# Patient Record
Sex: Female | Born: 1935 | Race: White | Hispanic: No | Marital: Married | Smoking: Never smoker
Health system: Southern US, Community
[De-identification: ages and names within clinical notes are randomized; demographics above are authoritative.]

## PROBLEM LIST (undated history)

## (undated) DIAGNOSIS — Z780 Asymptomatic menopausal state: Secondary | ICD-10-CM

## (undated) DIAGNOSIS — K219 Gastro-esophageal reflux disease without esophagitis: Secondary | ICD-10-CM

## (undated) HISTORY — PX: ABDOMINAL HYSTERECTOMY: SHX81

## (undated) HISTORY — PX: TONSILLECTOMY: SUR1361

## (undated) HISTORY — PX: OTHER SURGICAL HISTORY: SHX169

## (undated) HISTORY — DX: Gastro-esophageal reflux disease without esophagitis: K21.9

## (undated) HISTORY — DX: Asymptomatic menopausal state: Z78.0

---

## 1998-04-13 ENCOUNTER — Other Ambulatory Visit: Admission: RE | Admit: 1998-04-13 | Discharge: 1998-04-13 | Payer: Self-pay | Admitting: Radiology

## 1998-09-27 ENCOUNTER — Ambulatory Visit (HOSPITAL_COMMUNITY): Admission: RE | Admit: 1998-09-27 | Discharge: 1998-09-27 | Payer: Self-pay | Admitting: General Surgery

## 1998-09-27 ENCOUNTER — Encounter: Payer: Self-pay | Admitting: General Surgery

## 2005-07-17 ENCOUNTER — Encounter: Admission: RE | Admit: 2005-07-17 | Discharge: 2005-07-17 | Payer: Self-pay | Admitting: Family Medicine

## 2006-06-17 ENCOUNTER — Ambulatory Visit (HOSPITAL_COMMUNITY): Admission: RE | Admit: 2006-06-17 | Discharge: 2006-06-17 | Payer: Self-pay | Admitting: Gastroenterology

## 2006-08-02 IMAGING — CR DG ABDOMEN 2V
3 series · 3 of 3 positions shown · non-contrast
Comparison: none

CLINICAL DATA: Left lower quadrant pain. 
 ABDOMEN ? 2 VIEW:
 Supine and erect views of the abdomen show no bowel obstruction.  No free air is seen.  There is degenerative change in the lower lumbar spine.

[view not recorded (1 of 3)]
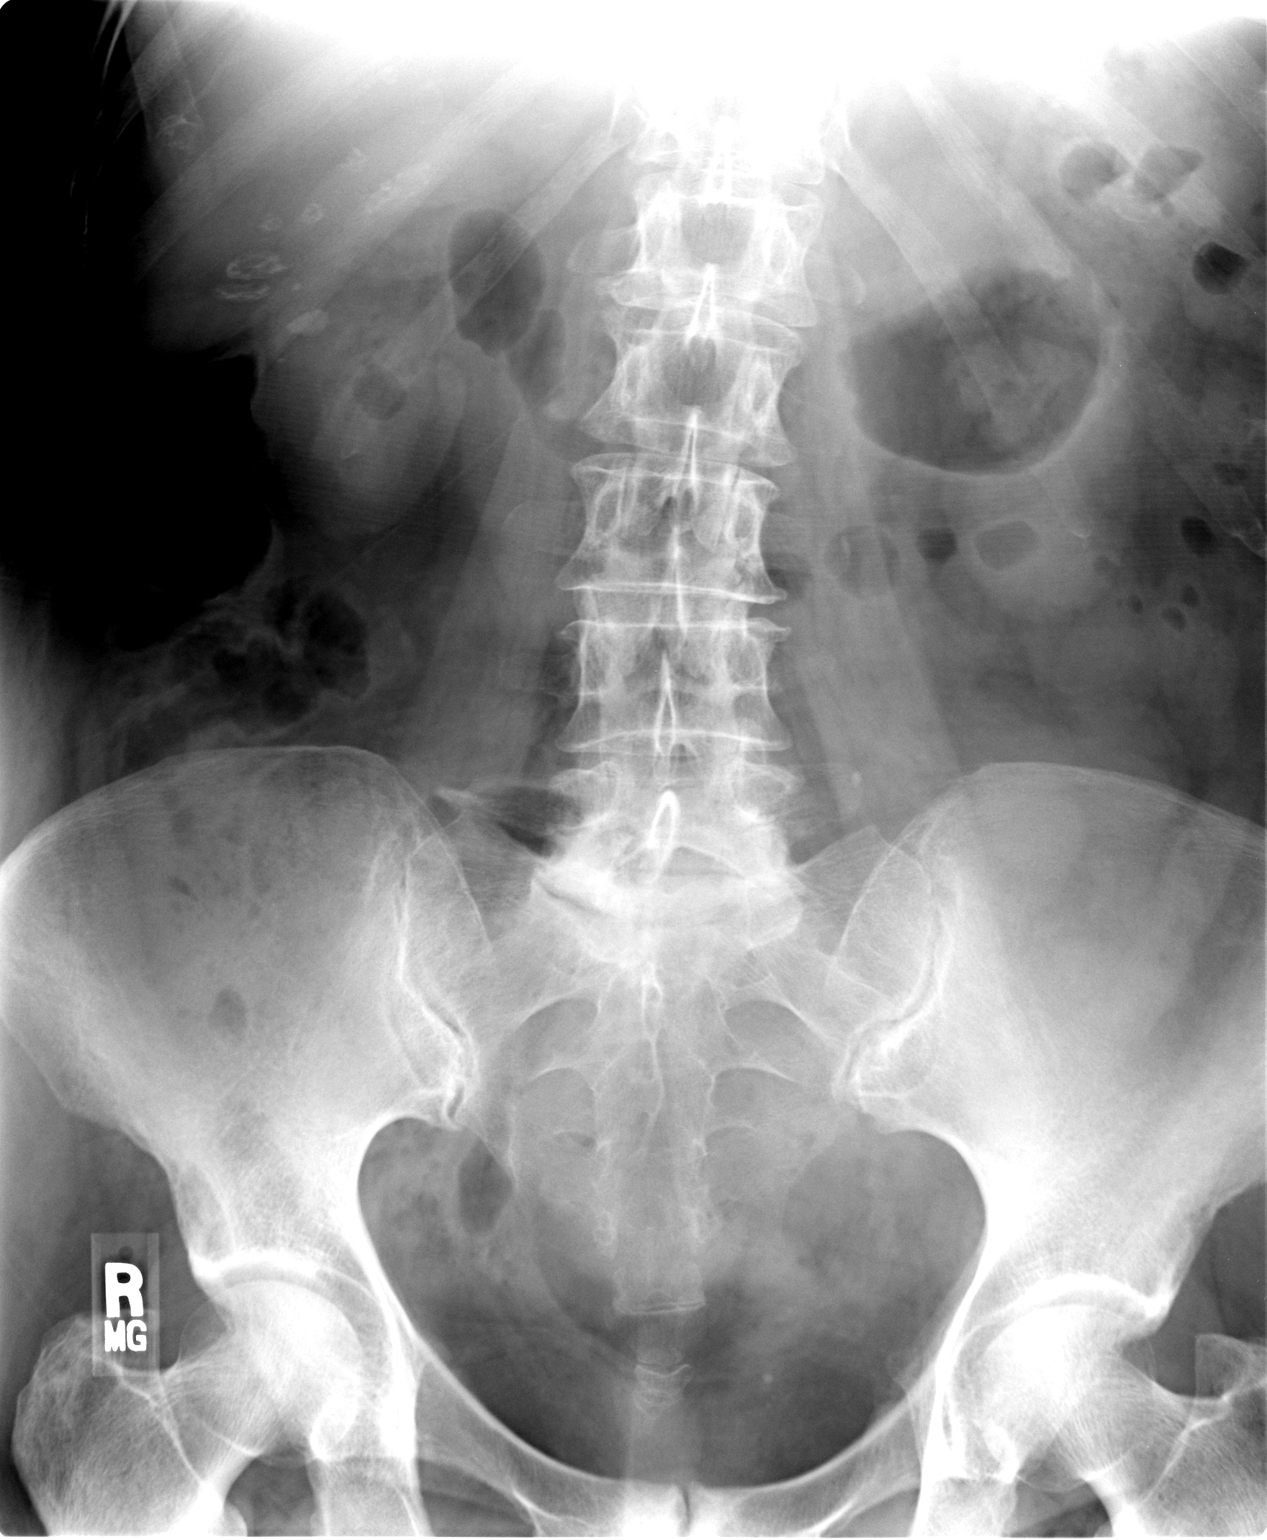

[view not recorded (2 of 3)]
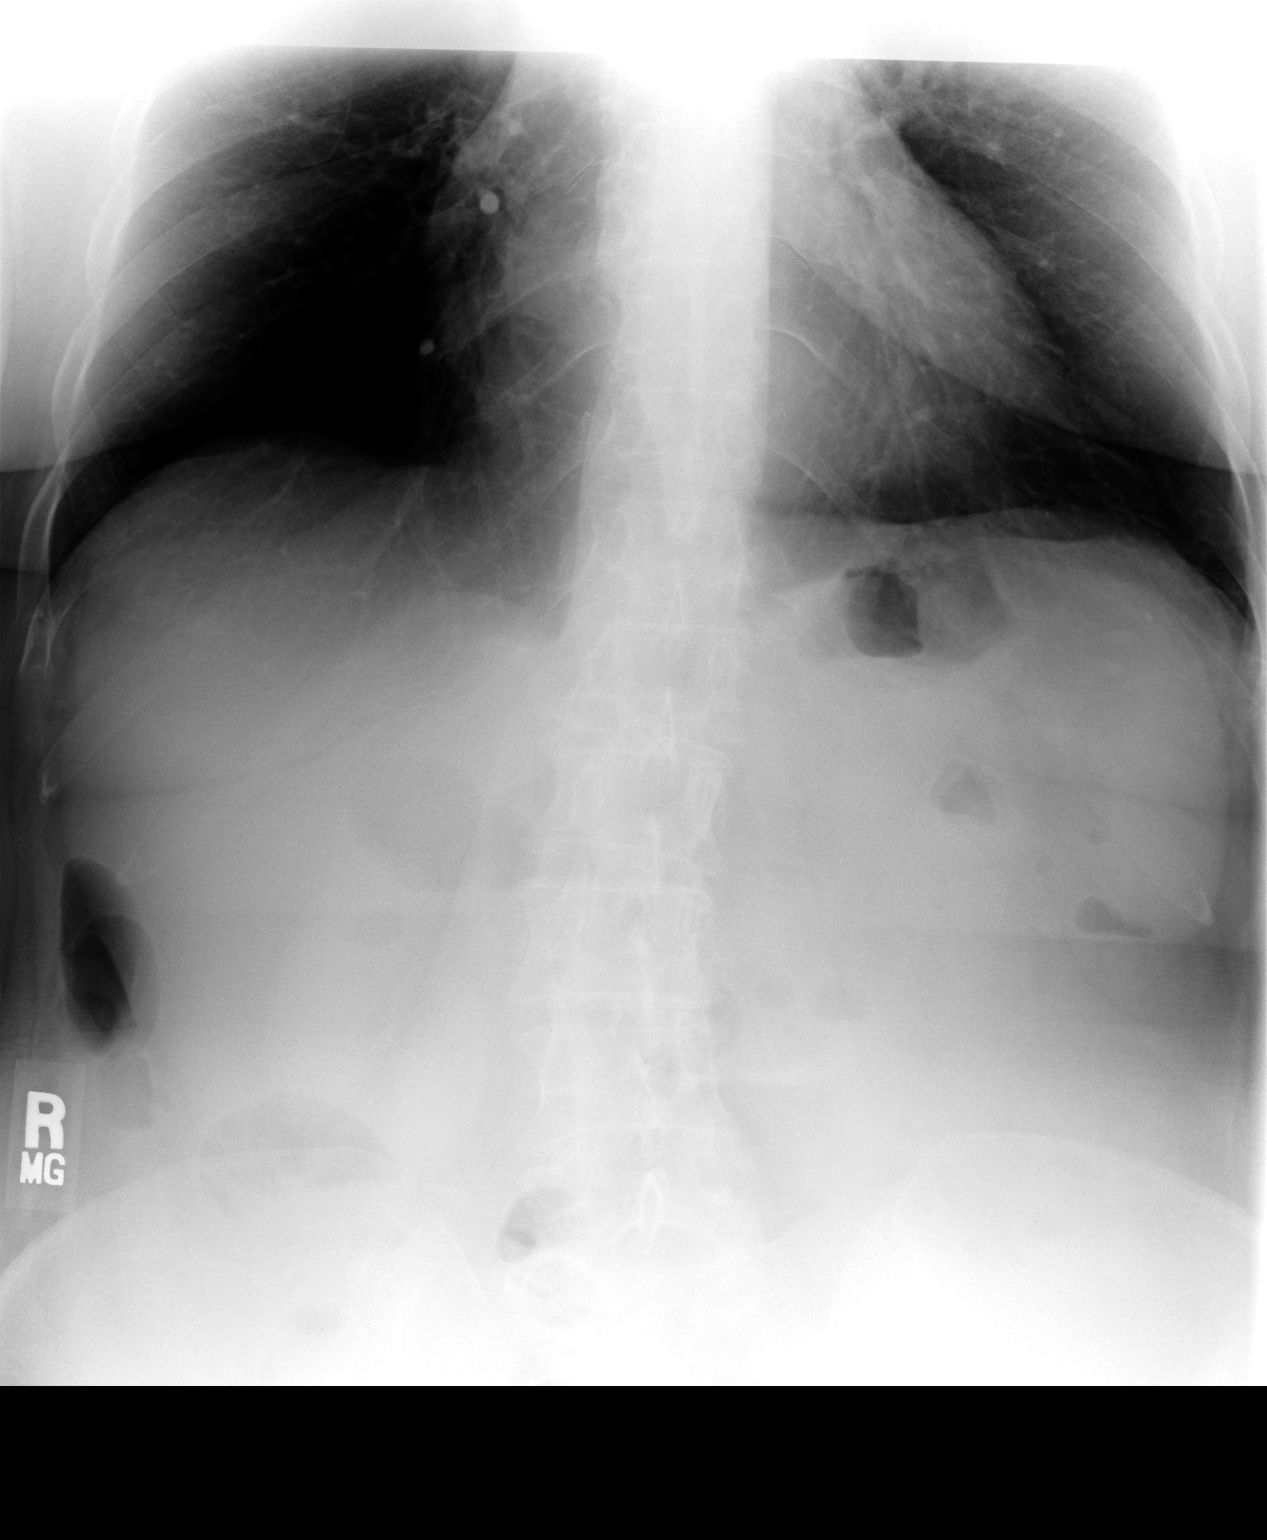

[view not recorded (3 of 3)]
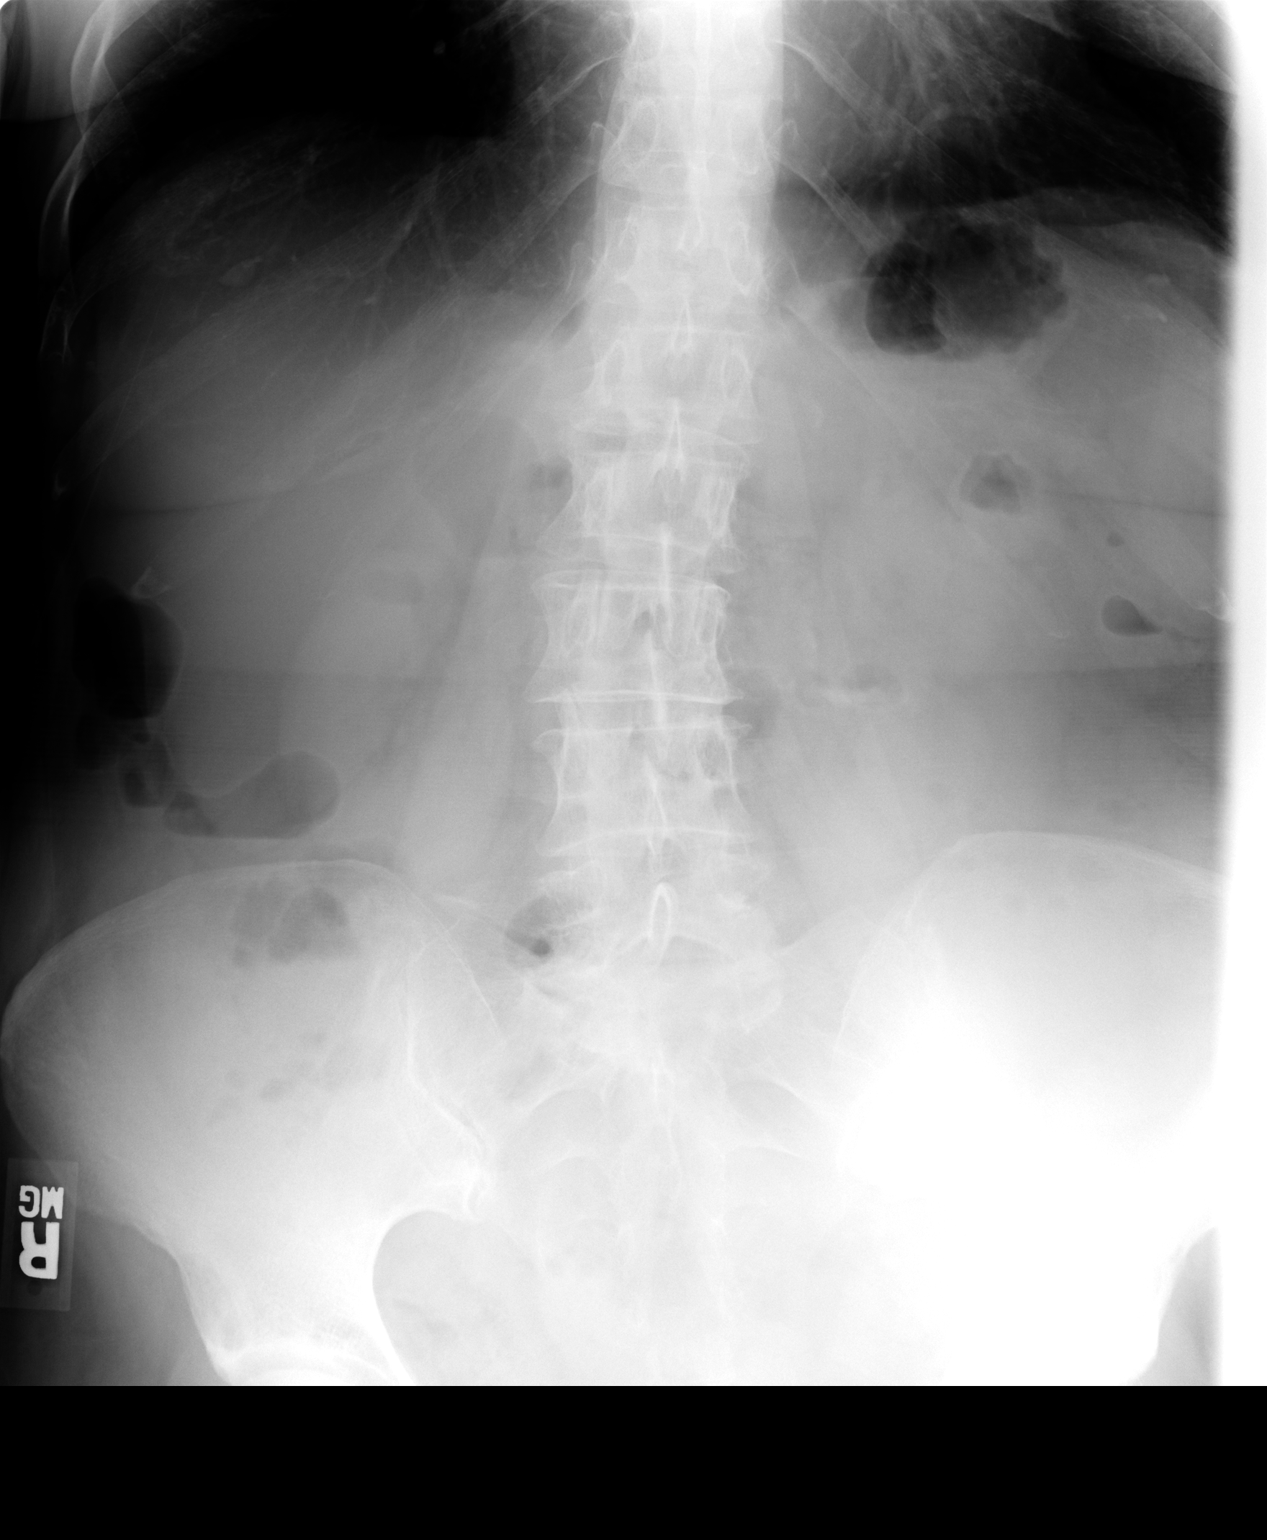

[3 of 3 positions shown; findings below may reference images not displayed]

IMPRESSION: No obstruction or free air.

## 2014-09-21 ENCOUNTER — Encounter: Payer: Self-pay | Admitting: *Deleted

## 2016-04-09 DIAGNOSIS — B079 Viral wart, unspecified: Secondary | ICD-10-CM | POA: Diagnosis not present

## 2016-05-15 DIAGNOSIS — B079 Viral wart, unspecified: Secondary | ICD-10-CM | POA: Diagnosis not present

## 2016-06-03 DIAGNOSIS — Z1231 Encounter for screening mammogram for malignant neoplasm of breast: Secondary | ICD-10-CM | POA: Diagnosis not present

## 2016-06-13 DIAGNOSIS — Z0000 Encounter for general adult medical examination without abnormal findings: Secondary | ICD-10-CM | POA: Diagnosis not present

## 2016-06-13 DIAGNOSIS — R7309 Other abnormal glucose: Secondary | ICD-10-CM | POA: Diagnosis not present

## 2016-06-13 DIAGNOSIS — B079 Viral wart, unspecified: Secondary | ICD-10-CM | POA: Diagnosis not present

## 2016-08-15 DIAGNOSIS — H5213 Myopia, bilateral: Secondary | ICD-10-CM | POA: Diagnosis not present

## 2016-10-02 DIAGNOSIS — Z23 Encounter for immunization: Secondary | ICD-10-CM | POA: Diagnosis not present

## 2017-06-04 DIAGNOSIS — Z1231 Encounter for screening mammogram for malignant neoplasm of breast: Secondary | ICD-10-CM | POA: Diagnosis not present

## 2017-06-06 DIAGNOSIS — R922 Inconclusive mammogram: Secondary | ICD-10-CM | POA: Diagnosis not present

## 2017-07-03 DIAGNOSIS — R7309 Other abnormal glucose: Secondary | ICD-10-CM | POA: Diagnosis not present

## 2017-07-03 DIAGNOSIS — Z23 Encounter for immunization: Secondary | ICD-10-CM | POA: Diagnosis not present

## 2017-07-03 DIAGNOSIS — Z0000 Encounter for general adult medical examination without abnormal findings: Secondary | ICD-10-CM | POA: Diagnosis not present

## 2017-07-03 DIAGNOSIS — L309 Dermatitis, unspecified: Secondary | ICD-10-CM | POA: Diagnosis not present

## 2017-10-02 DIAGNOSIS — Z23 Encounter for immunization: Secondary | ICD-10-CM | POA: Diagnosis not present

## 2018-01-16 DIAGNOSIS — H5203 Hypermetropia, bilateral: Secondary | ICD-10-CM | POA: Diagnosis not present

## 2018-06-03 DIAGNOSIS — Z1231 Encounter for screening mammogram for malignant neoplasm of breast: Secondary | ICD-10-CM | POA: Diagnosis not present

## 2018-07-08 DIAGNOSIS — Z0000 Encounter for general adult medical examination without abnormal findings: Secondary | ICD-10-CM | POA: Diagnosis not present

## 2018-07-08 DIAGNOSIS — R252 Cramp and spasm: Secondary | ICD-10-CM | POA: Diagnosis not present

## 2018-07-08 DIAGNOSIS — R7309 Other abnormal glucose: Secondary | ICD-10-CM | POA: Diagnosis not present

## 2018-07-08 DIAGNOSIS — L309 Dermatitis, unspecified: Secondary | ICD-10-CM | POA: Diagnosis not present

## 2018-09-25 DIAGNOSIS — Z23 Encounter for immunization: Secondary | ICD-10-CM | POA: Diagnosis not present

## 2018-12-17 DIAGNOSIS — Z0120 Encounter for dental examination and cleaning without abnormal findings: Secondary | ICD-10-CM | POA: Diagnosis not present

## 2019-06-07 DIAGNOSIS — Z1231 Encounter for screening mammogram for malignant neoplasm of breast: Secondary | ICD-10-CM | POA: Diagnosis not present

## 2019-07-21 DIAGNOSIS — H5213 Myopia, bilateral: Secondary | ICD-10-CM | POA: Diagnosis not present

## 2019-08-13 DIAGNOSIS — R7309 Other abnormal glucose: Secondary | ICD-10-CM | POA: Diagnosis not present

## 2019-08-13 DIAGNOSIS — L309 Dermatitis, unspecified: Secondary | ICD-10-CM | POA: Diagnosis not present

## 2019-08-13 DIAGNOSIS — B356 Tinea cruris: Secondary | ICD-10-CM | POA: Diagnosis not present

## 2019-08-13 DIAGNOSIS — Z0000 Encounter for general adult medical examination without abnormal findings: Secondary | ICD-10-CM | POA: Diagnosis not present

## 2019-08-20 DIAGNOSIS — Z23 Encounter for immunization: Secondary | ICD-10-CM | POA: Diagnosis not present

## 2019-08-20 DIAGNOSIS — R7309 Other abnormal glucose: Secondary | ICD-10-CM | POA: Diagnosis not present

## 2019-12-16 DIAGNOSIS — Z0120 Encounter for dental examination and cleaning without abnormal findings: Secondary | ICD-10-CM | POA: Diagnosis not present

## 2020-03-28 DIAGNOSIS — H9312 Tinnitus, left ear: Secondary | ICD-10-CM | POA: Diagnosis not present

## 2020-03-28 DIAGNOSIS — H9113 Presbycusis, bilateral: Secondary | ICD-10-CM | POA: Insufficient documentation

## 2020-04-13 DIAGNOSIS — H90A21 Sensorineural hearing loss, unilateral, right ear, with restricted hearing on the contralateral side: Secondary | ICD-10-CM | POA: Diagnosis not present

## 2020-06-19 DIAGNOSIS — Z1231 Encounter for screening mammogram for malignant neoplasm of breast: Secondary | ICD-10-CM | POA: Diagnosis not present

## 2020-08-09 DIAGNOSIS — Z961 Presence of intraocular lens: Secondary | ICD-10-CM | POA: Diagnosis not present

## 2020-08-09 DIAGNOSIS — H35363 Drusen (degenerative) of macula, bilateral: Secondary | ICD-10-CM | POA: Diagnosis not present

## 2020-08-09 DIAGNOSIS — H35372 Puckering of macula, left eye: Secondary | ICD-10-CM | POA: Diagnosis not present

## 2020-08-15 DIAGNOSIS — R7309 Other abnormal glucose: Secondary | ICD-10-CM | POA: Diagnosis not present

## 2020-08-15 DIAGNOSIS — L84 Corns and callosities: Secondary | ICD-10-CM | POA: Diagnosis not present

## 2020-08-15 DIAGNOSIS — Z0000 Encounter for general adult medical examination without abnormal findings: Secondary | ICD-10-CM | POA: Diagnosis not present

## 2020-08-15 DIAGNOSIS — K429 Umbilical hernia without obstruction or gangrene: Secondary | ICD-10-CM | POA: Diagnosis not present

## 2020-11-09 ENCOUNTER — Emergency Department (HOSPITAL_COMMUNITY)
Admission: EM | Admit: 2020-11-09 | Discharge: 2020-11-09 | Disposition: A | Discharge status: Home / Self Care | Payer: Medicare Other | Attending: Emergency Medicine | Admitting: Emergency Medicine

## 2020-11-09 ENCOUNTER — Emergency Department (HOSPITAL_COMMUNITY): Payer: Medicare Other

## 2020-11-09 ENCOUNTER — Other Ambulatory Visit: Payer: Self-pay

## 2020-11-09 ENCOUNTER — Encounter (HOSPITAL_COMMUNITY): Payer: Self-pay

## 2020-11-09 DIAGNOSIS — W01198A Fall on same level from slipping, tripping and stumbling with subsequent striking against other object, initial encounter: Secondary | ICD-10-CM | POA: Insufficient documentation

## 2020-11-09 DIAGNOSIS — S0101XA Laceration without foreign body of scalp, initial encounter: Secondary | ICD-10-CM | POA: Insufficient documentation

## 2020-11-09 DIAGNOSIS — S0191XA Laceration without foreign body of unspecified part of head, initial encounter: Secondary | ICD-10-CM | POA: Diagnosis not present

## 2020-11-09 DIAGNOSIS — W19XXXA Unspecified fall, initial encounter: Secondary | ICD-10-CM

## 2020-11-09 DIAGNOSIS — S0990XA Unspecified injury of head, initial encounter: Secondary | ICD-10-CM | POA: Diagnosis present

## 2020-11-09 MED ORDER — LIDOCAINE-EPINEPHRINE 1 %-1:100000 IJ SOLN
10.0000 mL | Freq: Once | INTRAMUSCULAR | Status: AC
  Administered 2020-11-09: 10 mL
  Filled 2020-11-09: qty 1

## 2020-11-09 NOTE — ED Notes (Signed)
Cleansed pt's wound

## 2020-11-09 NOTE — ED Provider Notes (Signed)
[facility] EMERGENCY DEPARTMENT Provider Note   CSN: [id number] Arrival date & time: 11/09/20  1601     History Chief Complaint  Patient presents with  . Fall    Jeanne Wright is a 86 y.o. female.  86 year old female presents with laceration to the scalp after a fall. Patient states she was in her basement when she lost her balance and fell backwards, hitting the back of her head on a support pole. Denies feeling weak or dizzy prior to the fall, denies loss of consciousness, is not anticoagulated. Patient was able to get herself back up after the fall and has been ambulatory without difficulty. Denies neck or back pain, upper or lower extremity injury. No other complaints or concerns.         Past Medical History:  Diagnosis Date  . Esophageal reflux   . Menopause     There are no problems to display for this patient.   Past Surgical History:  Procedure Laterality Date  . ABDOMINAL HYSTERECTOMY    . LEFT BREAST LUMP    . TONSILLECTOMY       OB History   No obstetric history on file.     Family History  Problem Relation Age of Onset  . Diabetes Mother   . Leukemia Father   . Diabetes Sister   . Hypertension Sister   . Cancer Sister   . Cancer Sister   . Diabetes Sister     Social History   Tobacco Use  . Smoking status: Never Smoker  Substance Use Topics  . Alcohol use: No  . Drug use: No    Home Medications Prior to Admission medications   Medication Sig Start Date End Date Taking? Authorizing Provider  Calcium Carbonate-Vitamin D (CALCIUM 600+D) 600-400 MG-UNIT per tablet Take 1 tablet by mouth daily.    [provider]  Vitamins-Lipotropics (LIPOFLAVONOID PO) Take by mouth.    [provider]    Allergies    Sulfa antibiotics  Review of Systems   Review of Systems  Constitutional: Negative for fever.  Gastrointestinal: Negative for nausea and vomiting.  Musculoskeletal: Negative for arthralgias,  back pain, gait problem, joint swelling, myalgias, neck pain and neck stiffness.  Skin: Positive for wound.  Allergic/Immunologic: Negative for immunocompromised state.  Neurological: Positive for headaches. Negative for dizziness, syncope and weakness.  Hematological: Does not bruise/bleed easily.  Psychiatric/Behavioral: Negative for confusion.  All other systems reviewed and are negative.   Physical Exam Updated Vital Signs BP (!) 165/60 (BP Location: Right Arm)   Pulse 66   Temp 98 F (36.7 C) (Oral)   Resp 15   SpO2 99%   Physical Exam Vitals and nursing note reviewed.  Constitutional:      General: She is not in acute distress.    Appearance: She is well-developed. She is not diaphoretic.  HENT:     Head: Normocephalic.      Mouth/Throat:     Mouth: Mucous membranes are moist.  Eyes:     Pupils: Pupils are equal, round, and reactive to light.  Cardiovascular:     Pulses: Normal pulses.  Pulmonary:     Effort: Pulmonary effort is normal.  Musculoskeletal:        General: No swelling or tenderness.     Cervical back: Normal range of motion. No tenderness.  Skin:    General: Skin is warm and dry.     Findings: No erythema or rash.  Neurological:  Mental Status: She is alert and oriented to person, place, and time.     Sensory: No sensory deficit.     Motor: No weakness.  Psychiatric:        Behavior: Behavior normal.     ED Results / Procedures / Treatments   Labs (all labs ordered are listed, but only abnormal results are displayed) Labs Reviewed - No data to display  EKG None  Radiology CT Head Wo Contrast  Result Date: 11/09/2020 CLINICAL DATA:  Trauma.  Fall down stairs. EXAM: CT HEAD WITHOUT CONTRAST TECHNIQUE: Contiguous axial images were obtained from the base of the skull through the vertex without intravenous contrast. COMPARISON:  None. FINDINGS: Brain: There is no mass, hemorrhage or extra-axial collection. The size and configuration of the  ventricles and extra-axial CSF spaces are normal. There is hypoattenuation of the white matter, most commonly indicating chronic small vessel disease. Small meningioma near the vertex measuring approximately 6 mm. Vascular: No abnormal hyperdensity of the major intracranial arteries or dural venous sinuses. No intracranial atherosclerosis. Skull: Large right vertex laceration and hematoma. Sinuses/Orbits: No fluid levels or advanced mucosal thickening of the visualized paranasal sinuses. No mastoid or middle ear effusion. The orbits are normal. IMPRESSION: 1. Large right vertex laceration and hematoma without underlying fracture or acute intracranial abnormality. 2. Small meningioma near the vertex measuring approximately 6 mm. Electronically Signed   By: Deatra Robinson M.D.   On: 11/09/2020 01:44    Procedures .Marland KitchenLaceration Repair  Date/Time: 11/09/2020 8:40 AM Performed by: Jeannie Fend, PA-C Authorized by: Jeannie Fend, PA-C   Consent:    Consent obtained:  Verbal   Consent given by:  Patient   Risks discussed:  Infection, need for additional repair, pain, poor cosmetic result and poor wound healing   Alternatives discussed:  No treatment and delayed treatment Universal protocol:    Procedure explained and questions answered to patient or proxy's satisfaction: yes     Relevant documents present and verified: yes     Test results available and properly labeled: yes     Imaging studies available: yes     Required blood products, implants, devices, and special equipment available: yes     Site/side marked: yes     Immediately prior to procedure, a time out was called: yes     Patient identity confirmed:  Verbally with patient Anesthesia (see MAR for exact dosages):    Anesthesia method:  Local infiltration   Local anesthetic:  Lidocaine 2% WITH epi Laceration details:    Location:  Scalp   Scalp location:  Occipital   Length (cm):  2.5   Depth (mm):  3 Repair type:    Repair type:   Simple Pre-procedure details:    Preparation:  Patient was prepped and draped in usual sterile fashion and imaging obtained to evaluate for foreign bodies Exploration:    Hemostasis achieved with:  Epinephrine   Wound exploration: wound explored through full range of motion and entire depth of wound probed and visualized     Wound extent: no foreign bodies/material noted, no muscle damage noted and no underlying fracture noted     Contaminated: no   Treatment:    Area cleansed with:  Saline   Amount of cleaning:  Standard   Irrigation solution:  Sterile saline Skin repair:    Repair method:  Sutures   Suture size:  4-0   Suture material:  Nylon   Suture technique:  Simple interrupted   Number  of sutures:  3 Approximation:    Approximation:  Close Post-procedure details:    Dressing:  Open (no dressing)   Patient tolerance of procedure:  Tolerated well, no immediate complications   (including critical care time)  Medications Ordered in ED Medications  lidocaine-EPINEPHrine (XYLOCAINE W/EPI) 1 %-1:100000 (with pres) injection 10 mL (10 mLs Infiltration Given 11/09/20 0803)    ED Course  I have reviewed the triage vital signs and the nursing notes.  Pertinent labs & imaging results that were available during my care of the patient were reviewed by me and considered in my medical decision making (see chart for details).  Clinical Course as of Nov 09 925  Thu Nov 09, 2020  3662 86 yo female with scalp laceration after mechanical fall last night. On exam, patient is well appearing, laceration to posterior scalp without active bleeding. CT head with meningioma, no acute traumatic findings.  No pain in the neck, back, extremities. Patient states she fell to her right side in the fall and has slight discomfort in her right buttock area, declines hip XR, ROM right hip normal and not painful. Advised to recheck if she has persistent pain as she is declining XR today. Scalp laceration  was cleaned and closed with sutures. Recommend recheck with PCP in 2 days, suture removal in 1 week, return to ER as needed. Patient is here with her daughter today, lives with her husband at their home.    [LM]    Clinical Course User Index [LM] Alden Hipp   MDM Rules/Calculators/A&P                          Final Clinical Impression(s) / ED Diagnoses Final diagnoses:  Fall, initial encounter  Laceration of scalp, initial encounter    Rx / DC Orders ED Discharge Orders    None       Jeannie Fend, PA-C 11/09/20 9476    Alvira Monday, MD 11/10/20 [phone]

## 2020-11-09 NOTE — ED Triage Notes (Signed)
Pt states that she tripped and fell on some stairs and has a laceration to back of head, no LOC

## 2020-11-09 NOTE — ED Notes (Signed)
GAve pts dtr discharge instructions. Pts dtr will take pt home

## 2020-11-13 DIAGNOSIS — T148XXA Other injury of unspecified body region, initial encounter: Secondary | ICD-10-CM | POA: Diagnosis not present

## 2020-11-13 DIAGNOSIS — S0101XA Laceration without foreign body of scalp, initial encounter: Secondary | ICD-10-CM | POA: Diagnosis not present

## 2020-11-20 DIAGNOSIS — S0101XA Laceration without foreign body of scalp, initial encounter: Secondary | ICD-10-CM | POA: Diagnosis not present

## 2020-11-20 DIAGNOSIS — Z4802 Encounter for removal of sutures: Secondary | ICD-10-CM | POA: Diagnosis not present

## 2020-11-23 ENCOUNTER — Ambulatory Visit: Payer: Medicare Other | Admitting: Podiatry

## 2020-11-23 ENCOUNTER — Other Ambulatory Visit: Payer: Self-pay

## 2020-11-23 ENCOUNTER — Encounter: Payer: Self-pay | Admitting: Podiatry

## 2020-11-23 DIAGNOSIS — M21619 Bunion of unspecified foot: Secondary | ICD-10-CM | POA: Diagnosis not present

## 2020-11-23 DIAGNOSIS — M79675 Pain in left toe(s): Secondary | ICD-10-CM

## 2020-11-23 DIAGNOSIS — B351 Tinea unguium: Secondary | ICD-10-CM | POA: Diagnosis not present

## 2020-11-23 DIAGNOSIS — M79674 Pain in right toe(s): Secondary | ICD-10-CM | POA: Diagnosis not present

## 2020-11-27 NOTE — Progress Notes (Signed)
Subjective:   Patient ID: Jeanne Wright, female   DOB: 86 y.o.   MRN: [id number]   HPI Patient presents stating she has very thick toenails that she cannot type and presents with caregiver who also cannot take care of her.  States that she does not smoke and is not active but would like to be able to wear shoe gear   Review of Systems  All other systems reviewed and are negative.       Objective:  Physical Exam Vitals and nursing note reviewed.  Constitutional:      Appearance: She is well-developed and well-nourished.  Cardiovascular:     Pulses: Intact distal pulses.  Pulmonary:     Effort: Pulmonary effort is normal.  Musculoskeletal:        General: Normal range of motion.  Skin:    General: Skin is warm.  Neurological:     Mental Status: She is alert.     Vascular status moderately diminished with diminished DP PT pulses with neurological showing diminished sharp dull vibratory.  Patient is found to have thick yellow brittle nailbeds 1-5 both feet that are painful when pressed and make shoe gear difficult and is found to have good digital perfusion well oriented x3     Assessment:  Mycotic nail infection with pain 1-5 both feet and at risk patient     Plan:  H&P diabetic and exam discussed with patient and daily inspections discussed with caregiver.  Debrided nailbeds 1-5 both feet no iatrogenic bleeding reappoint routine care

## 2021-03-06 ENCOUNTER — Ambulatory Visit: Payer: Medicare Other | Admitting: Podiatry

## 2021-03-06 ENCOUNTER — Encounter: Payer: Self-pay | Admitting: Podiatry

## 2021-03-06 ENCOUNTER — Other Ambulatory Visit: Payer: Self-pay

## 2021-03-06 DIAGNOSIS — M79674 Pain in right toe(s): Secondary | ICD-10-CM | POA: Diagnosis not present

## 2021-03-06 DIAGNOSIS — M79675 Pain in left toe(s): Secondary | ICD-10-CM

## 2021-03-06 DIAGNOSIS — B351 Tinea unguium: Secondary | ICD-10-CM | POA: Diagnosis not present

## 2021-03-06 DIAGNOSIS — M21619 Bunion of unspecified foot: Secondary | ICD-10-CM

## 2021-03-06 NOTE — Progress Notes (Addendum)
This patient returns to the office for evaluation and treatment of long thick painful nails .  This patient is unable to trim her own nails since the patient cannot reach her feet.  Patient says the nails are painful walking and wearing his shoes.  She presents to the office with her daughter  He returns for preventive foot care services.  General Appearance  Alert, conversant and in no acute stress.  Vascular  Dorsalis pedis and posterior tibial  pulses are palpable  bilaterally.  Capillary return is within normal limits  bilaterally. Temperature is within normal limits  bilaterally.  Neurologic  Senn-Weinstein monofilament wire test within normal limits  bilaterally. Muscle power within normal limits bilaterally.  Nails Thick disfigured discolored nails with subungual debris  from hallux to fifth toes bilaterally. No evidence of bacterial infection or drainage bilaterally.  Orthopedic  No limitations of motion  feet .  No crepitus or effusions noted. HAV  B/L with hammer toes.  Skin  normotropic skin with no porokeratosis noted bilaterally.  No signs of infections or ulcers noted.     Onychomycosis  Pain in toes right foot  Pain in toes left foot  Debridement  of nails  1-5  B/L with a nail nipper.  Nails were then filed using a dremel tool with no incidents.    RTC  3 months    Helane Gunther DPM

## 2021-06-06 ENCOUNTER — Encounter: Payer: Self-pay | Admitting: Podiatry

## 2021-06-06 ENCOUNTER — Other Ambulatory Visit: Payer: Self-pay

## 2021-06-06 ENCOUNTER — Ambulatory Visit: Payer: Medicare Other | Admitting: Podiatry

## 2021-06-06 DIAGNOSIS — M79674 Pain in right toe(s): Secondary | ICD-10-CM | POA: Diagnosis not present

## 2021-06-06 DIAGNOSIS — M21619 Bunion of unspecified foot: Secondary | ICD-10-CM | POA: Diagnosis not present

## 2021-06-06 DIAGNOSIS — B356 Tinea cruris: Secondary | ICD-10-CM | POA: Insufficient documentation

## 2021-06-06 DIAGNOSIS — M79675 Pain in left toe(s): Secondary | ICD-10-CM | POA: Diagnosis not present

## 2021-06-06 DIAGNOSIS — B079 Viral wart, unspecified: Secondary | ICD-10-CM | POA: Insufficient documentation

## 2021-06-06 DIAGNOSIS — K429 Umbilical hernia without obstruction or gangrene: Secondary | ICD-10-CM | POA: Insufficient documentation

## 2021-06-06 DIAGNOSIS — B351 Tinea unguium: Secondary | ICD-10-CM

## 2021-06-06 DIAGNOSIS — L309 Dermatitis, unspecified: Secondary | ICD-10-CM | POA: Insufficient documentation

## 2021-06-06 DIAGNOSIS — R739 Hyperglycemia, unspecified: Secondary | ICD-10-CM | POA: Insufficient documentation

## 2021-06-06 NOTE — Progress Notes (Signed)
This patient returns to the office for evaluation and treatment of long thick painful nails .  This patient is unable to trim her own nails since the patient cannot reach her feet.  Patient says the nails are painful walking and wearing his shoes.  She presents to the office with her daughter  He returns for preventive foot care services.  General Appearance  Alert, conversant and in no acute stress.  Vascular  Dorsalis pedis and posterior tibial  pulses are palpable  bilaterally.  Capillary return is within normal limits  bilaterally. Temperature is within normal limits  bilaterally.  Neurologic  Senn-Weinstein monofilament wire test within normal limits  bilaterally. Muscle power within normal limits bilaterally.  Nails Thick disfigured discolored nails with subungual debris  from hallux to fifth toes bilaterally. No evidence of bacterial infection or drainage bilaterally.  Orthopedic  No limitations of motion  feet .  No crepitus or effusions noted. HAV  B/L with hammer toes.  Skin  normotropic skin with no porokeratosis noted bilaterally.  No signs of infections or ulcers noted.     Onychomycosis  Pain in toes right foot  Pain in toes left foot  Debridement  of nails  1-5  B/L with a nail nipper.  Nails were then filed using a dremel tool with no incidents.    RTC  3 months    Helane Gunther DPM

## 2021-06-25 DIAGNOSIS — Z1231 Encounter for screening mammogram for malignant neoplasm of breast: Secondary | ICD-10-CM | POA: Diagnosis not present

## 2021-08-09 DIAGNOSIS — H35363 Drusen (degenerative) of macula, bilateral: Secondary | ICD-10-CM | POA: Diagnosis not present

## 2021-08-09 DIAGNOSIS — H04123 Dry eye syndrome of bilateral lacrimal glands: Secondary | ICD-10-CM | POA: Diagnosis not present

## 2021-08-09 DIAGNOSIS — Z961 Presence of intraocular lens: Secondary | ICD-10-CM | POA: Diagnosis not present

## 2021-08-09 DIAGNOSIS — H35372 Puckering of macula, left eye: Secondary | ICD-10-CM | POA: Diagnosis not present

## 2021-08-24 DIAGNOSIS — K429 Umbilical hernia without obstruction or gangrene: Secondary | ICD-10-CM | POA: Diagnosis not present

## 2021-08-24 DIAGNOSIS — R7309 Other abnormal glucose: Secondary | ICD-10-CM | POA: Diagnosis not present

## 2021-08-24 DIAGNOSIS — Z0000 Encounter for general adult medical examination without abnormal findings: Secondary | ICD-10-CM | POA: Diagnosis not present

## 2021-08-24 DIAGNOSIS — L309 Dermatitis, unspecified: Secondary | ICD-10-CM | POA: Diagnosis not present

## 2021-08-24 DIAGNOSIS — B356 Tinea cruris: Secondary | ICD-10-CM | POA: Diagnosis not present

## 2021-09-12 ENCOUNTER — Encounter: Payer: Self-pay | Admitting: Podiatry

## 2021-09-12 ENCOUNTER — Ambulatory Visit: Payer: Medicare Other | Admitting: Podiatry

## 2021-09-12 ENCOUNTER — Other Ambulatory Visit: Payer: Self-pay

## 2021-09-12 DIAGNOSIS — M21619 Bunion of unspecified foot: Secondary | ICD-10-CM | POA: Diagnosis not present

## 2021-09-12 DIAGNOSIS — M79675 Pain in left toe(s): Secondary | ICD-10-CM

## 2021-09-12 DIAGNOSIS — M79674 Pain in right toe(s): Secondary | ICD-10-CM | POA: Diagnosis not present

## 2021-09-12 DIAGNOSIS — B351 Tinea unguium: Secondary | ICD-10-CM | POA: Diagnosis not present

## 2021-09-12 NOTE — Progress Notes (Signed)
This patient returns to the office for evaluation and treatment of long thick painful nails .  This patient is unable to trim her own nails since the patient cannot reach her feet.  Patient says the nails are painful walking and wearing his shoes.  She presents to the office with her daughter  He returns for preventive foot care services.  General Appearance  Alert, conversant and in no acute stress.  Vascular  Dorsalis pedis and posterior tibial  pulses are palpable  bilaterally.  Capillary return is within normal limits  bilaterally. Temperature is within normal limits  bilaterally.  Neurologic  Senn-Weinstein monofilament wire test within normal limits  bilaterally. Muscle power within normal limits bilaterally.  Nails Thick disfigured discolored nails with subungual debris  from hallux to fifth toes bilaterally. No evidence of bacterial infection or drainage bilaterally.  Orthopedic  No limitations of motion  feet .  No crepitus or effusions noted. HAV  B/L with hammer toes.  Skin  normotropic skin with no porokeratosis noted bilaterally.  No signs of infections or ulcers noted.     Onychomycosis  Pain in toes right foot  Pain in toes left foot  Debridement  of nails  1-5  B/L with a nail nipper.  Nails were then filed using a dremel tool with no incidents.    RTC  3 months    Damian Buckles DPM  

## 2021-09-22 DIAGNOSIS — Z23 Encounter for immunization: Secondary | ICD-10-CM | POA: Diagnosis not present

## 2021-11-09 ENCOUNTER — Telehealth: Payer: Self-pay | Admitting: *Deleted

## 2021-11-09 NOTE — Telephone Encounter (Signed)
Patient's daughter calling w/ questions about her nail care, wanting nails a little smoother with trim,tearing her stockings,explained that she may request at appointment how she may want nails filed for smoother look,verbalized understanding.

## 2021-12-19 ENCOUNTER — Ambulatory Visit: Payer: Medicare Other | Admitting: Podiatry

## 2022-01-29 ENCOUNTER — Telehealth: Payer: Self-pay | Admitting: *Deleted

## 2022-01-29 NOTE — Telephone Encounter (Signed)
Patient's daughter is calling for recommendations for treatment of difficult corns on toes. How often for trimming, what can be done to help with maintenance. Will discuss in more detail at next appointment.Please advise.

## 2022-03-20 ENCOUNTER — Ambulatory Visit: Payer: Medicare Other | Admitting: Podiatry

## 2022-07-08 DIAGNOSIS — Z1231 Encounter for screening mammogram for malignant neoplasm of breast: Secondary | ICD-10-CM | POA: Diagnosis not present

## 2022-07-08 DIAGNOSIS — Z780 Asymptomatic menopausal state: Secondary | ICD-10-CM | POA: Diagnosis not present

## 2022-08-14 DIAGNOSIS — Z961 Presence of intraocular lens: Secondary | ICD-10-CM | POA: Diagnosis not present

## 2022-08-14 DIAGNOSIS — H35363 Drusen (degenerative) of macula, bilateral: Secondary | ICD-10-CM | POA: Diagnosis not present

## 2022-08-14 DIAGNOSIS — H35372 Puckering of macula, left eye: Secondary | ICD-10-CM | POA: Diagnosis not present

## 2022-08-14 DIAGNOSIS — H04123 Dry eye syndrome of bilateral lacrimal glands: Secondary | ICD-10-CM | POA: Diagnosis not present

## 2022-08-26 DIAGNOSIS — Z0000 Encounter for general adult medical examination without abnormal findings: Secondary | ICD-10-CM | POA: Diagnosis not present

## 2022-08-26 DIAGNOSIS — Z23 Encounter for immunization: Secondary | ICD-10-CM | POA: Diagnosis not present

## 2022-08-26 DIAGNOSIS — K429 Umbilical hernia without obstruction or gangrene: Secondary | ICD-10-CM | POA: Diagnosis not present

## 2022-08-26 DIAGNOSIS — B356 Tinea cruris: Secondary | ICD-10-CM | POA: Diagnosis not present

## 2022-12-16 DIAGNOSIS — K080 Exfoliation of teeth due to systemic causes: Secondary | ICD-10-CM | POA: Diagnosis not present

## 2023-02-18 DIAGNOSIS — H903 Sensorineural hearing loss, bilateral: Secondary | ICD-10-CM | POA: Diagnosis not present

## 2023-07-21 DIAGNOSIS — Z1231 Encounter for screening mammogram for malignant neoplasm of breast: Secondary | ICD-10-CM | POA: Diagnosis not present

## 2023-07-28 DIAGNOSIS — R922 Inconclusive mammogram: Secondary | ICD-10-CM | POA: Diagnosis not present

## 2023-08-21 DIAGNOSIS — H35372 Puckering of macula, left eye: Secondary | ICD-10-CM | POA: Diagnosis not present

## 2023-08-21 DIAGNOSIS — H04123 Dry eye syndrome of bilateral lacrimal glands: Secondary | ICD-10-CM | POA: Diagnosis not present

## 2023-08-21 DIAGNOSIS — H35363 Drusen (degenerative) of macula, bilateral: Secondary | ICD-10-CM | POA: Diagnosis not present

## 2023-08-21 DIAGNOSIS — H1045 Other chronic allergic conjunctivitis: Secondary | ICD-10-CM | POA: Diagnosis not present

## 2023-08-28 DIAGNOSIS — K429 Umbilical hernia without obstruction or gangrene: Secondary | ICD-10-CM | POA: Diagnosis not present

## 2023-08-28 DIAGNOSIS — E2839 Other primary ovarian failure: Secondary | ICD-10-CM | POA: Diagnosis not present

## 2023-08-28 DIAGNOSIS — E7800 Pure hypercholesterolemia, unspecified: Secondary | ICD-10-CM | POA: Diagnosis not present

## 2023-08-28 DIAGNOSIS — Z0000 Encounter for general adult medical examination without abnormal findings: Secondary | ICD-10-CM | POA: Diagnosis not present

## 2023-10-03 NOTE — ACP (Advance Care Planning) (Signed)
Healthcare Power of Attorney (Scanned).

## 2023-10-08 DIAGNOSIS — M25559 Pain in unspecified hip: Secondary | ICD-10-CM | POA: Diagnosis not present

## 2023-10-08 DIAGNOSIS — R030 Elevated blood-pressure reading, without diagnosis of hypertension: Secondary | ICD-10-CM | POA: Diagnosis not present

## 2023-10-09 DIAGNOSIS — M545 Low back pain, unspecified: Secondary | ICD-10-CM | POA: Diagnosis not present

## 2023-10-09 DIAGNOSIS — M25551 Pain in right hip: Secondary | ICD-10-CM | POA: Diagnosis not present

## 2023-10-14 DIAGNOSIS — M47816 Spondylosis without myelopathy or radiculopathy, lumbar region: Secondary | ICD-10-CM | POA: Diagnosis not present

## 2023-10-14 DIAGNOSIS — M7061 Trochanteric bursitis, right hip: Secondary | ICD-10-CM | POA: Diagnosis not present

## 2023-10-20 DIAGNOSIS — M545 Low back pain, unspecified: Secondary | ICD-10-CM | POA: Diagnosis not present

## 2023-10-22 DIAGNOSIS — M47816 Spondylosis without myelopathy or radiculopathy, lumbar region: Secondary | ICD-10-CM | POA: Diagnosis not present

## 2023-10-22 DIAGNOSIS — M7061 Trochanteric bursitis, right hip: Secondary | ICD-10-CM | POA: Diagnosis not present

## 2023-10-30 DIAGNOSIS — M7061 Trochanteric bursitis, right hip: Secondary | ICD-10-CM | POA: Diagnosis not present

## 2023-10-30 DIAGNOSIS — M47816 Spondylosis without myelopathy or radiculopathy, lumbar region: Secondary | ICD-10-CM | POA: Diagnosis not present

## 2023-11-04 ENCOUNTER — Other Ambulatory Visit (HOSPITAL_BASED_OUTPATIENT_CLINIC_OR_DEPARTMENT_OTHER): Payer: Self-pay

## 2023-11-04 MED ORDER — COMIRNATY 30 MCG/0.3ML IM SUSY
0.3000 mL | PREFILLED_SYRINGE | Freq: Once | INTRAMUSCULAR | 0 refills | Status: AC
  Filled 2023-11-04: qty 0.3, 1d supply, fill #0

## 2023-11-13 DIAGNOSIS — M47816 Spondylosis without myelopathy or radiculopathy, lumbar region: Secondary | ICD-10-CM | POA: Diagnosis not present

## 2023-11-13 DIAGNOSIS — M7061 Trochanteric bursitis, right hip: Secondary | ICD-10-CM | POA: Diagnosis not present

## 2023-12-17 DIAGNOSIS — K080 Exfoliation of teeth due to systemic causes: Secondary | ICD-10-CM | POA: Diagnosis not present

## 2023-12-25 ENCOUNTER — Other Ambulatory Visit (HOSPITAL_BASED_OUTPATIENT_CLINIC_OR_DEPARTMENT_OTHER): Payer: Self-pay

## 2023-12-25 MED ORDER — AREXVY 120 MCG/0.5ML IM SUSR
0.5000 mL | Freq: Once | INTRAMUSCULAR | 0 refills | Status: AC
  Filled 2023-12-25: qty 0.5, 1d supply, fill #0

## 2024-05-02 ENCOUNTER — Ambulatory Visit (INDEPENDENT_AMBULATORY_CARE_PROVIDER_SITE_OTHER)

## 2024-05-02 ENCOUNTER — Encounter (HOSPITAL_COMMUNITY): Payer: Self-pay | Admitting: Emergency Medicine

## 2024-05-02 ENCOUNTER — Ambulatory Visit (HOSPITAL_COMMUNITY)
Admission: EM | Admit: 2024-05-02 | Discharge: 2024-05-02 | Disposition: A | Discharge status: Home / Self Care | Attending: Nurse Practitioner | Admitting: Nurse Practitioner

## 2024-05-02 DIAGNOSIS — I700 Atherosclerosis of aorta: Secondary | ICD-10-CM | POA: Diagnosis not present

## 2024-05-02 DIAGNOSIS — S51012A Laceration without foreign body of left elbow, initial encounter: Secondary | ICD-10-CM

## 2024-05-02 DIAGNOSIS — R079 Chest pain, unspecified: Secondary | ICD-10-CM | POA: Diagnosis not present

## 2024-05-02 DIAGNOSIS — R0781 Pleurodynia: Secondary | ICD-10-CM

## 2024-05-02 DIAGNOSIS — S81812A Laceration without foreign body, left lower leg, initial encounter: Secondary | ICD-10-CM

## 2024-05-02 DIAGNOSIS — R918 Other nonspecific abnormal finding of lung field: Secondary | ICD-10-CM | POA: Diagnosis not present

## 2024-05-02 DIAGNOSIS — J929 Pleural plaque without asbestos: Secondary | ICD-10-CM | POA: Diagnosis not present

## 2024-05-02 DIAGNOSIS — W19XXXA Unspecified fall, initial encounter: Secondary | ICD-10-CM | POA: Diagnosis not present

## 2024-05-02 NOTE — Discharge Instructions (Signed)
 The chest x-ray does not show any broken ribs today.  Your vital signs are stable and lungs sound clear which is great.  Continue Aspercreme and Tylenol as needed for rib cage pain.  Make sure you are staying active and taking deep breaths to prevent pneumonia.  Pain should improve over the next few weeks.  Regarding the cuts on your left elbow and left leg, clean twice daily with soap and water and apply a thin layer of bacitracin and then cover with nonadherent gauze.  Continue this until it heals and then you can leave open to air.  Seek care if you develop redness, swelling, or drainage from the areas.

## 2024-05-02 NOTE — ED Triage Notes (Signed)
 Pt was working in flower bed on Friday and fell. Pt has injury to left elbow, left lower leg and left ribs. Patient has been self treating at home. Took tylenol and used cream.

## 2024-05-02 NOTE — ED Provider Notes (Signed)
 [facility]    CSN: [id number] Arrival date & time: 05/02/24  1643      History   Chief Complaint Chief Complaint  Patient presents with   Fall   Elbow Injury   Leg Injury   Rib Injury    HPI Jeanne Wright is a 89 y.o. female.   Patient presents today with daughter for 3-day history of left rib cage pain after falling in the flower bed.  She also reports skin tears to left elbow and left lower extremity that are bruised and scabbed over.  No drainage from the skin tears.  No bruising to the rib cage.  She has pain in her rib cage with certain movements and with deep breathing.  No fever, nausea/vomiting, change in appetite, change in behavior.  Has been cleaning the areas with alcohol and applying bacitracin and covering with a Band-Aid.  Has been using Aspercreme and Tylenol for the rib cage pain.  Patient is confident Tdap has been updated in the past 5 years.    Past Medical History:  Diagnosis Date   Esophageal reflux    Menopause     Patient Active Problem List   Diagnosis Date Noted   Eczema 06/06/2021   Hyperglycemia 06/06/2021   Tinea cruris 06/06/2021   Umbilical hernia 06/06/2021   Wart 06/06/2021   Presbycusis of both ears 03/28/2020   Tinnitus of left ear 03/28/2020    Past Surgical History:  Procedure Laterality Date   ABDOMINAL HYSTERECTOMY     LEFT BREAST LUMP     TONSILLECTOMY      OB History   No obstetric history on file.      Home Medications    Prior to Admission medications   Medication Sig Start Date End Date Taking? Authorizing Provider  Calcium Carbonate-Vit D-Min (CALCIUM 600+D PLUS MINERALS) 600-400 MG-UNIT TABS Take by mouth.    [provider]  Calcium Carbonate-Vitamin D3 (CALCIUM 600+D) 600-400 MG-UNIT TABS 1 or 2 tablet    [provider]  ketoconazole (NIZORAL) 2 % cream Apply topically daily as needed. 08/30/20   [provider]  triamcinolone  cream (KENALOG) 0.5 %  SMARTSIG:Sparingly Topical Twice Daily PRN 08/30/20   [provider]  UNABLE TO FIND Take by mouth.    [provider]  Vitamins-Lipotropics (LIPOFLAVONOID PO)     [provider]    Family History Family History  Problem Relation Age of Onset   Diabetes Mother    Leukemia Father    Diabetes Sister    Hypertension Sister    Cancer Sister    Cancer Sister    Diabetes Sister     Social History Social History   Tobacco Use   Smoking status: Never   Smokeless tobacco: Never  Substance Use Topics   Alcohol use: No   Drug use: No     Allergies   Sulfa antibiotics and Sulfur   Review of Systems Review of Systems Per HPI  Physical Exam Triage Vital Signs ED Triage Vitals  Encounter Vitals Group     BP 05/02/24 1729 (!) 165/89     Systolic BP Percentile --      Diastolic BP Percentile --      Pulse Rate 05/02/24 1729 81     Resp 05/02/24 1729 19     Temp 05/02/24 1729 97.7 F (36.5 C)     Temp Source 05/02/24 1729 Oral     SpO2 05/02/24 1729 96 %  Weight --      Height --      Head Circumference --      Peak Flow --      Pain Score 05/02/24 1727 5     Pain Loc --      Pain Education --      Exclude from Growth Chart --    No data found.  Updated Vital Signs BP (!) 165/89 (BP Location: Right Arm)   Pulse 81   Temp 97.7 F (36.5 C) (Oral)   Resp 19   SpO2 96%   Visual Acuity Right Eye Distance:   Left Eye Distance:   Bilateral Distance:    Right Eye Near:   Left Eye Near:    Bilateral Near:     Physical Exam Vitals and nursing note reviewed.  Constitutional:      General: She is not in acute distress.    Appearance: Normal appearance. She is not toxic-appearing.  HENT:     Head: Normocephalic and atraumatic.     Right Ear: External ear normal.     Left Ear: External ear normal.     Mouth/Throat:     Mouth: Mucous membranes are moist.     Pharynx: Oropharynx is clear.  Cardiovascular:     Rate and Rhythm:  Normal rate.  Pulmonary:     Effort: Pulmonary effort is normal. No respiratory distress.     Breath sounds: Normal breath sounds. No wheezing, rhonchi or rales.  Chest:     Comments: Chest wall tenderness to left rib cage diffusely; no point tenderness and no bruising Musculoskeletal:     Cervical back: Normal range of motion.  Lymphadenopathy:     Cervical: No cervical adenopathy.  Skin:    General: Skin is warm and dry.     Capillary Refill: Capillary refill takes less than 2 seconds.     Coloration: Skin is not jaundiced or pale.     Findings: Abrasion present. No erythema.     Comments: Skin tear left elbow and left lower extremity.  No surrounding erythema, active drainage, or tenderness to touch.  Neurological:     Mental Status: She is alert and oriented to person, place, and time.  Psychiatric:        Behavior: Behavior is cooperative.      UC Treatments / Results  Labs (all labs ordered are listed, but only abnormal results are displayed) Labs Reviewed - No data to display  EKG   Radiology DG Chest 2 View Result Date: 05/02/2024 CLINICAL DATA:  Pain after fall EXAM: CHEST - 2 VIEW COMPARISON:  None Available. FINDINGS: Hyperinflation. Biapical pleural thickening. There is some linear opacity at the bases likely scar or atelectasis. Normal cardiopericardial silhouette. Calcified aorta. No consolidation, pneumothorax or effusion. No edema. Degenerative changes of the spine and shoulders. Curvature of the spine. IMPRESSION: Hyperinflation.  Basilar scar or atelectasis. Electronically Signed   By: Adrianna Horde M.D.   On: 05/02/2024 18:03    Procedures Procedures (including critical care time)  Medications Ordered in UC Medications - No data to display  Initial Impression / Assessment and Plan / UC Course  I have reviewed the triage vital signs and the nursing notes.  Pertinent labs & imaging results that were available during my care of the patient were reviewed by  me and considered in my medical decision making (see chart for details).   Patient is mildly hypertensive in triage, otherwise vital signs are stable.  1. Fall,  initial encounter 2. Rib pain on left side Chest x-ray is negative for acute rib fracture Suspect costochondritis, supportive care discussed with patient ER and return precautions also discussed  3. Skin tear of left elbow without complication, initial encounter 4. Skin tear of left lower leg without complication, initial encounter No signs or symptoms of infection or cellulitis Wound care discussed and applied today Return for persistent/worsening symptoms despite treatment  The patient was given the opportunity to ask questions.  All questions answered to their satisfaction.  The patient is in agreement to this plan.   Final Clinical Impressions(s) / UC Diagnoses   Final diagnoses:  Fall, initial encounter  Rib pain on left side  Skin tear of left elbow without complication, initial encounter  Skin tear of left lower leg without complication, initial encounter     Discharge Instructions      The chest x-ray does not show any broken ribs today.  Your vital signs are stable and lungs sound clear which is great.  Continue Aspercreme and Tylenol as needed for rib cage pain.  Make sure you are staying active and taking deep breaths to prevent pneumonia.  Pain should improve over the next few weeks.  Regarding the cuts on your left elbow and left leg, clean twice daily with soap and water and apply a thin layer of bacitracin and then cover with nonadherent gauze.  Continue this until it heals and then you can leave open to air.  Seek care if you develop redness, swelling, or drainage from the areas.  ED Prescriptions   None    PDMP not reviewed this encounter.   Wilhemena Harbour, NP 05/02/24 [phone]

## 2024-05-21 ENCOUNTER — Telehealth: Payer: Self-pay

## 2024-05-21 NOTE — Telephone Encounter (Signed)
 Patient's daughter called - she has a painful corn on her fifth toe - they are asking what they can do for it. Patient is 63 - does she need to come in to be assessed?

## 2024-05-24 NOTE — Telephone Encounter (Signed)
 yes

## 2024-06-02 ENCOUNTER — Ambulatory Visit: Admitting: Podiatry

## 2024-06-02 ENCOUNTER — Encounter: Payer: Self-pay | Admitting: Podiatry

## 2024-06-02 DIAGNOSIS — L84 Corns and callosities: Secondary | ICD-10-CM | POA: Diagnosis not present

## 2024-06-02 DIAGNOSIS — M2042 Other hammer toe(s) (acquired), left foot: Secondary | ICD-10-CM | POA: Diagnosis not present

## 2024-06-02 NOTE — Progress Notes (Signed)
  Subjective:  Patient ID: Jeanne Wright, female    DOB: 1931-11-12,   MRN: [id number]  Chief Complaint  Patient presents with   Callouses    Rm 24 Painful corn left 5th toe/1 month/ hurts with pressure and shoes/padding and change of shoe gear.    89 y.o. female presents for concern of painful corn on her left fifth toe that has been present for years. She has tried different shoes and padding and still been a problem  . Denies any other pedal complaints. Denies n/v/f/c.   Past Medical History:  Diagnosis Date   Esophageal reflux    Menopause     Objective:  Physical Exam: Vascular: DP/PT pulses 2/4 bilateral. CFT <3 seconds. Normal hair growth on digits. No edema.  Skin. No lacerations or abrasions bilateral feet. Hyperkeratotic lesion noted to dorsum of left fifht digit with tenderness to palpation.  Musculoskeletal: MMT 5/5 bilateral lower extremities in DF, PF, Inversion and Eversion. Deceased ROM in DF of ankle joint.  Hammered digits 2-5 bilateral.  Neurological: Sensation intact to light touch.   Assessment:   1. Hammertoe of left foot   2. Corns and callosities      Plan:  Patient was evaluated and treated and all questions answered.  -Educated on hammertoes and treatment options  -Discussed padding including toe caps and crest pads.  -Discussed need for potential surgery if pain does not improved.  -Patient to follow-up as needed. Discussed calling if any changes or increased pain.    Asberry Failing, DPM

## 2024-08-30 DIAGNOSIS — L309 Dermatitis, unspecified: Secondary | ICD-10-CM | POA: Diagnosis not present

## 2024-08-30 DIAGNOSIS — R296 Repeated falls: Secondary | ICD-10-CM | POA: Diagnosis not present

## 2024-08-30 DIAGNOSIS — Z23 Encounter for immunization: Secondary | ICD-10-CM | POA: Diagnosis not present

## 2024-08-30 DIAGNOSIS — E7800 Pure hypercholesterolemia, unspecified: Secondary | ICD-10-CM | POA: Diagnosis not present

## 2024-08-30 DIAGNOSIS — E2839 Other primary ovarian failure: Secondary | ICD-10-CM | POA: Diagnosis not present

## 2024-08-30 DIAGNOSIS — K429 Umbilical hernia without obstruction or gangrene: Secondary | ICD-10-CM | POA: Diagnosis not present

## 2024-08-30 DIAGNOSIS — Z0000 Encounter for general adult medical examination without abnormal findings: Secondary | ICD-10-CM | POA: Diagnosis not present
